# Patient Record
Sex: Female | Born: 1965 | Race: Black or African American | Hispanic: No | Marital: Single | Smoking: Never smoker
Health system: Southern US, Community
[De-identification: ages and names within clinical notes are randomized; demographics above are authoritative.]

## PROBLEM LIST (undated history)

## (undated) DIAGNOSIS — I10 Essential (primary) hypertension: Secondary | ICD-10-CM

## (undated) DIAGNOSIS — F32A Depression, unspecified: Secondary | ICD-10-CM

## (undated) DIAGNOSIS — E119 Type 2 diabetes mellitus without complications: Secondary | ICD-10-CM

## (undated) DIAGNOSIS — E7800 Pure hypercholesterolemia, unspecified: Secondary | ICD-10-CM

## (undated) DIAGNOSIS — F419 Anxiety disorder, unspecified: Secondary | ICD-10-CM

## (undated) DIAGNOSIS — F329 Major depressive disorder, single episode, unspecified: Secondary | ICD-10-CM

---

## 2004-03-08 ENCOUNTER — Ambulatory Visit: Payer: Self-pay | Admitting: Internal Medicine

## 2007-02-22 ENCOUNTER — Ambulatory Visit: Payer: Self-pay | Admitting: Internal Medicine

## 2007-02-22 ENCOUNTER — Other Ambulatory Visit: Payer: Self-pay

## 2017-02-06 ENCOUNTER — Emergency Department
Admission: EM | Admit: 2017-02-06 | Discharge: 2017-02-06 | Disposition: A | Discharge status: Home / Self Care | Payer: BLUE CROSS/BLUE SHIELD | Attending: Emergency Medicine | Admitting: Emergency Medicine

## 2017-02-06 ENCOUNTER — Encounter: Payer: Self-pay | Admitting: Emergency Medicine

## 2017-02-06 DIAGNOSIS — I10 Essential (primary) hypertension: Secondary | ICD-10-CM | POA: Diagnosis not present

## 2017-02-06 DIAGNOSIS — R42 Dizziness and giddiness: Secondary | ICD-10-CM | POA: Diagnosis present

## 2017-02-06 DIAGNOSIS — E119 Type 2 diabetes mellitus without complications: Secondary | ICD-10-CM | POA: Insufficient documentation

## 2017-02-06 DIAGNOSIS — R110 Nausea: Secondary | ICD-10-CM | POA: Insufficient documentation

## 2017-02-06 DIAGNOSIS — H81391 Other peripheral vertigo, right ear: Secondary | ICD-10-CM | POA: Insufficient documentation

## 2017-02-06 HISTORY — DX: Essential (primary) hypertension: I10

## 2017-02-06 HISTORY — DX: Type 2 diabetes mellitus without complications: E11.9

## 2017-02-06 HISTORY — DX: Pure hypercholesterolemia, unspecified: E78.00

## 2017-02-06 LAB — COMPREHENSIVE METABOLIC PANEL
ALBUMIN: 4 g/dL (ref 3.5–5.0)
ALT: 33 U/L (ref 14–54)
AST: 24 U/L (ref 15–41)
Alkaline Phosphatase: 73 U/L (ref 38–126)
Anion gap: 5 (ref 5–15)
BUN: 7 mg/dL (ref 6–20)
CHLORIDE: 106 mmol/L (ref 101–111)
CO2: 25 mmol/L (ref 22–32)
CREATININE: 0.58 mg/dL (ref 0.44–1.00)
Calcium: 8.9 mg/dL (ref 8.9–10.3)
GFR calc Af Amer: >60 mL/min (ref >60)
GLUCOSE: 100 mg/dL — AB (ref 65–99)
Potassium: 3.7 mmol/L (ref 3.5–5.1)
SODIUM: 136 mmol/L (ref 135–145)
Total Bilirubin: 0.6 mg/dL (ref 0.3–1.2)
Total Protein: 7.3 g/dL (ref 6.5–8.1)

## 2017-02-06 LAB — TROPONIN I: Troponin I: <0.03 ng/mL (ref <0.03)

## 2017-02-06 LAB — URINALYSIS, COMPLETE (UACMP) WITH MICROSCOPIC
Bilirubin Urine: NEGATIVE
GLUCOSE, UA: NEGATIVE mg/dL
Hgb urine dipstick: NEGATIVE
Ketones, ur: NEGATIVE mg/dL
LEUKOCYTES UA: NEGATIVE
Nitrite: NEGATIVE
PROTEIN: NEGATIVE mg/dL
Specific Gravity, Urine: 1.018 (ref 1.005–1.030)
pH: 5 (ref 5.0–8.0)

## 2017-02-06 LAB — CBC
HCT: 37.6 % (ref 35.0–47.0)
Hemoglobin: 12.3 g/dL (ref 12.0–16.0)
MCH: 26.5 pg (ref 26.0–34.0)
MCHC: 32.8 g/dL (ref 32.0–36.0)
MCV: 80.9 fL (ref 80.0–100.0)
PLATELETS: 168 10*3/uL (ref 150–440)
RBC: 4.64 MIL/uL (ref 3.80–5.20)
RDW: 17.5 % — ABNORMAL HIGH (ref 11.5–14.5)
WBC: 9.1 10*3/uL (ref 3.6–11.0)

## 2017-02-06 LAB — POCT PREGNANCY, URINE: Preg Test, Ur: NEGATIVE

## 2017-02-06 MED ORDER — MECLIZINE HCL 25 MG PO TABS: 25.0000 mg | ORAL_TABLET | Freq: Three times a day (TID) | ORAL | 0 refills | Status: DC | PRN

## 2017-02-06 MED ORDER — MECLIZINE HCL 25 MG PO TABS
50.0000 mg | ORAL_TABLET | Freq: Once | ORAL | Status: AC
  Administered 2017-02-06: 50 mg via ORAL
  Filled 2017-02-06: qty 2

## 2017-02-06 NOTE — ED Triage Notes (Signed)
Dizzy and nauseated x 3 days. No fevers, no earache.

## 2017-02-06 NOTE — ED Provider Notes (Signed)
[facility] Emergency Department Provider Note  ____________________________________________   First MD Initiated Contact with Patient 02/06/17 2008     (approximate)  I have reviewed the triage vital signs and the nursing notes.   HISTORY  Chief Complaint Dizziness and Nausea    HPI Stacey Watts is a 51 y.o. female who self presents to the emergency department with 3 days of rooms spinning vertigo. The symptoms happen when she gets up or turns her head. They last roughly 1 minute and then stopped when she is at rest. When she has the vertigo she feels quite nauseated but has not thrown up. She denies headache. She denies chest pain shortness of breath abdominal pain or vomiting. She denies numbness or weakness. She denies trauma. She denies tinnitus. This has never happened to her before. Denies recent URI.   Past Medical History:  Diagnosis Date  . Diabetes mellitus without complication (HCC)   . Hypercholesterolemia   . Hypertension     There are no active problems to display for this patient.   History reviewed. No pertinent surgical history.  Prior to Admission medications   Medication Sig Start Date End Date Taking? Authorizing Provider  meclizine (ANTIVERT) 25 MG tablet Take 1 tablet (25 mg total) by mouth 3 (three) times daily as needed. 02/06/17   Merrily Brittle, MD    Allergies Patient has no known allergies.  No family history on file.  Social History Social History  Substance Use Topics  . Smoking status: Never Smoker  . Smokeless tobacco: Not on file  . Alcohol use Not on file    Review of Systems Constitutional: No fever/chills Eyes: No visual changes. ENT: No sore throat. Cardiovascular: Denies chest pain. Respiratory: Denies shortness of breath. Gastrointestinal: No abdominal pain.  positive for nausea, no vomiting.  No diarrhea.  No constipation. Genitourinary: Negative for dysuria. Musculoskeletal: Negative for  back pain. Skin: Negative for rash. Neurological: positive for vertigo.   ____________________________________________   PHYSICAL EXAM:  VITAL SIGNS: ED Triage Vitals  Enc Vitals Group     BP 02/06/17 1857 132/66     Pulse Rate 02/06/17 1857 72     Resp 02/06/17 1857 18     Temp 02/06/17 1857 98.2 F (36.8 C)     Temp Source 02/06/17 1857 Oral     SpO2 02/06/17 1857 96 %     Weight 02/06/17 1900 (!) 349 lb (158.3 kg)     Height 02/06/17 1900  (1.753 m)     Head Circumference --      Peak Flow --      Pain Score --      Pain Loc --      Pain Edu? --      Excl. in GC? --     Constitutional: alert and oriented 4 well appearing nontoxic no diaphoresis. Her symptoms Eyes: PERRL EOMI. lateral nystagmus fatigable on rightward gaze Head: Atraumatic. normal tympanic membrane on the left right tympanic membrane obscured by a large amount of cerumen Nose: No congestion/rhinnorhea. Mouth/Throat: No trismus Neck: No stridor.   Cardiovascular: Normal rate, regular rhythm. Grossly normal heart sounds.  Good peripheral circulation. Respiratory: Normal respiratory effort.  No retractions. Lungs CTAB and moving good air Gastrointestinal: obese soft nontender Musculoskeletal: No lower extremity edema   Neurologic:   cranial nerves II through XII intact aside from nystagmus No pronator drift  normal finger-nose-finger No dysdiadochokinesis Negative Romberg Skin:  Skin is warm, dry and intact.  No rash noted. Psychiatric: Mood and affect are normal. Speech and behavior are normal.    ____________________________________________   DIFFERENTIAL includes but not limited to  peripheral vertigo, central vertigo ____________________________________________   LABS (all labs ordered are listed, but only abnormal results are displayed)  Labs Reviewed  CBC - Abnormal; Notable for the following:       Result Value   RDW 17.5 (*)    All other components within normal limits    URINALYSIS, COMPLETE (UACMP) WITH MICROSCOPIC - Abnormal; Notable for the following:    Color, Urine YELLOW (*)    APPearance CLEAR (*)    Bacteria, UA RARE (*)    Squamous Epithelial / LPF 6-30 (*)    All other components within normal limits  COMPREHENSIVE METABOLIC PANEL - Abnormal; Notable for the following:    Glucose, Bld 100 (*)    All other components within normal limits  TROPONIN I  POC URINE PREG, ED  POCT PREGNANCY, URINE    blood work reviewed and interpreted by me as normal __________________________________________  EKG  ED ECG REPORT I, Merrily Brittle, the attending physician, personally viewed and interpreted this ECG.  Date: 02/06/2017 EKG Time:  Rate: 71 Rhythm: normal sinus rhythm QRS Axis: normal Intervals: first-degree AV block ST/T Wave abnormalities: normal Narrative Interpretation: no evidence of acute ischemia _____________________________________  RADIOLOGY   ____________________________________________   PROCEDURES  Procedure(s) performed: no  Procedures  Critical Care performed: no  Observation: no ____________________________________________   INITIAL IMPRESSION / ASSESSMENT AND PLAN / ED COURSE  Pertinent labs & imaging results that were available during my care of the patient were reviewed by me and considered in my medical decision making (see chart for details).  The patient arrives hemodynamically stable and well appearing. She has brief episodes of paroxysms of severe vertigo that is worse with movement. This is either secondary to BPPV or could be secondary to the significant congestion in her right tympanic canal. I was able to remove most of the cerumen and the patient does feel somewhat improved. I will treat her symptomatically with meclizine and refer her to otolaryngology. She verbalizes understanding and agreement with the plan.      ____________________________________________   FINAL CLINICAL IMPRESSION(S)  / ED DIAGNOSES  Final diagnoses:  Peripheral vertigo involving right ear      NEW MEDICATIONS STARTED DURING THIS VISIT:  Discharge Medication List as of 02/06/2017  8:25 PM    START taking these medications   Details  meclizine (ANTIVERT) 25 MG tablet Take 1 tablet (25 mg total) by mouth 3 (three) times daily as needed., Starting Mon 02/06/2017, Print         Note:  This document was prepared using Dragon voice recognition software and may include unintentional dictation errors.     Merrily Brittle, MD 02/06/17 2330

## 2017-02-06 NOTE — Discharge Instructions (Signed)
Please take your meclizine as needed for symptomatic treatment and make an appointment to follow up with the otolaryngologist within 1 week for reevaluation. Return to the emergency department for any concerns.  It was a pleasure to take care of you today, and thank you for coming to our emergency department.  If you have any questions or concerns before leaving please ask the nurse to grab me and I'm more than happy to go through your aftercare instructions again.  If you were prescribed any opioid pain medication today such as Norco, Vicodin, Percocet, morphine, hydrocodone, or oxycodone please make sure you do not drive when you are taking this medication as it can alter your ability to drive safely.  If you have any concerns once you are home that you are not improving or are in fact getting worse before you can make it to your follow-up appointment, please do not hesitate to call 911 and come back for further evaluation.  Merrily Brittle, MD  Results for orders placed or performed during the hospital encounter of 02/06/17  CBC  Result Value Ref Range   WBC 9.1 3.6 - 11.0 K/uL   RBC 4.64 3.80 - 5.20 MIL/uL   Hemoglobin 12.3 12.0 - 16.0 g/dL   HCT 96.0 45.4 - 09.8 %   MCV 80.9 80.0 - 100.0 fL   MCH 26.5 26.0 - 34.0 pg   MCHC 32.8 32.0 - 36.0 g/dL   RDW 11.9 (H) 14.7 - 82.9 %   Platelets 168 150 - 440 K/uL  Urinalysis, Complete w Microscopic  Result Value Ref Range   Color, Urine YELLOW (A) YELLOW   APPearance CLEAR (A) CLEAR   Specific Gravity, Urine 1.018 1.005 - 1.030   pH 5.0 5.0 - 8.0   Glucose, UA NEGATIVE NEGATIVE mg/dL   Hgb urine dipstick NEGATIVE NEGATIVE   Bilirubin Urine NEGATIVE NEGATIVE   Ketones, ur NEGATIVE NEGATIVE mg/dL   Protein, ur NEGATIVE NEGATIVE mg/dL   Nitrite NEGATIVE NEGATIVE   Leukocytes, UA NEGATIVE NEGATIVE   RBC / HPF 0-5 0 - 5 RBC/hpf   WBC, UA 0-5 0 - 5 WBC/hpf   Bacteria, UA RARE (A) NONE SEEN   Squamous Epithelial / LPF 6-30 (A) NONE SEEN   Mucus PRESENT   Comprehensive metabolic panel  Result Value Ref Range   Sodium 136 135 - 145 mmol/L   Potassium 3.7 3.5 - 5.1 mmol/L   Chloride 106 101 - 111 mmol/L   CO2 25 22 - 32 mmol/L   Glucose, Bld 100 (H) 65 - 99 mg/dL   BUN 7 6 - 20 mg/dL   Creatinine, Ser 5.62 0.44 - 1.00 mg/dL   Calcium 8.9 8.9 - 13.0 mg/dL   Total Protein 7.3 6.5 - 8.1 g/dL   Albumin 4.0 3.5 - 5.0 g/dL   AST 24 15 - 41 U/L   ALT 33 14 - 54 U/L   Alkaline Phosphatase 73 38 - 126 U/L   Total Bilirubin 0.6 0.3 - 1.2 mg/dL   GFR calc non Af Amer >60 >60 mL/min   GFR calc Af Amer >60 >60 mL/min   Anion gap 5 5 - 15  Troponin I  Result Value Ref Range   Troponin I <0.03 <0.03 ng/mL  Pregnancy, urine POC  Result Value Ref Range   Preg Test, Ur NEGATIVE NEGATIVE

## 2017-08-31 ENCOUNTER — Other Ambulatory Visit: Payer: Self-pay

## 2017-08-31 ENCOUNTER — Encounter: Payer: Self-pay | Admitting: Emergency Medicine

## 2017-08-31 ENCOUNTER — Ambulatory Visit (INDEPENDENT_AMBULATORY_CARE_PROVIDER_SITE_OTHER): Payer: BLUE CROSS/BLUE SHIELD

## 2017-08-31 ENCOUNTER — Ambulatory Visit
Admission: EM | Admit: 2017-08-31 | Discharge: 2017-08-31 | Disposition: A | Discharge status: Home / Self Care | Payer: BLUE CROSS/BLUE SHIELD | Attending: Family Medicine | Admitting: Family Medicine

## 2017-08-31 DIAGNOSIS — M79662 Pain in left lower leg: Secondary | ICD-10-CM

## 2017-08-31 DIAGNOSIS — W109XXA Fall (on) (from) unspecified stairs and steps, initial encounter: Secondary | ICD-10-CM

## 2017-08-31 DIAGNOSIS — M79605 Pain in left leg: Secondary | ICD-10-CM | POA: Diagnosis not present

## 2017-08-31 HISTORY — DX: Depression, unspecified: F32.A

## 2017-08-31 HISTORY — DX: Major depressive disorder, single episode, unspecified: F32.9

## 2017-08-31 HISTORY — DX: Anxiety disorder, unspecified: F41.9

## 2017-08-31 MED ORDER — TRAMADOL HCL 50 MG PO TABS: 50.0000 mg | ORAL_TABLET | Freq: Three times a day (TID) | ORAL | 0 refills | Status: AC | PRN

## 2017-08-31 NOTE — Discharge Instructions (Signed)
Xray negative.  Use the medication as directed.  Take care  Dr. Adriana Simasook

## 2017-08-31 NOTE — ED Triage Notes (Signed)
Patient in today c/o left leg pain and swelling and right toe pain after falling on 08/14/17. Patient states they are not getting better like she thinks they should. Patient has tried Ibuprofen, alternating ice and heat, elevated.

## 2017-08-31 NOTE — ED Provider Notes (Signed)
[facility]  CSN: [id number] Arrival date & time: 08/31/17  1421  History   Chief Complaint Chief Complaint  Patient presents with  . Leg Pain    left  . Toe Pain    right   HPI  52 year old female presents with the above complaints.  Patient states that on 4/1 she was going up some steps and fell, injuring her left leg predominantly.  She states that her right second toe was also affected but she is not particularly concerned about the toe.  Her primary complaint is left she is concerned however lower leg pain, particularly anteriorly.  Associated swelling.  She is taken some over-the-counter NSAIDs without significant improvement.  She states that she is able to ambulate without difficulty.  Given pain and lack of resolution since her injury.  No other associated symptoms.  No other complaints at this time.  Past Medical History:  Diagnosis Date  . Anxiety   . Depression   . Diabetes mellitus without complication (HCC)   . Hypercholesterolemia   . Hypertension    Surgical Hx - No past surgeries.  OB History   None    Home Medications    Prior to Admission medications   Medication Sig Start Date End Date Taking? Authorizing Provider  ALPRAZolam Prudy Feeler) 0.5 MG tablet Take by mouth.   Yes [provider]  atorvastatin (LIPITOR) 20 MG tablet TAKE 1 TABLET(S) EVERY DAY BY ORAL ROUTE FOR 90 DAYS. 04/06/16  Yes [provider]  citalopram (CELEXA) 40 MG tablet Take 40 mg by mouth daily. 06/10/17  Yes [provider]  losartan-hydrochlorothiazide (HYZAAR) 50-12.5 MG tablet Take by mouth. 05/04/16  Yes [provider]  traMADol (ULTRAM) 50 MG tablet Take 1 tablet (50 mg total) by mouth every 8 (eight) hours as needed. 08/31/17   Tommie Sams, DO   Family History Family History  Problem Relation Age of Onset  . Ovarian cancer Mother   . Breast cancer Mother   . Hypertension Mother   . Hyperlipidemia Mother   . Heart failure  Mother   . Other Father        suicide   Social History Social History   Tobacco Use  . Smoking status: Never Smoker  . Smokeless tobacco: Never Used  Substance Use Topics  . Alcohol use: Yes    Comment: socially  . Drug use: Never   Allergies   Patient has no known allergies.   Review of Systems Review of Systems  Constitutional: Negative.   Musculoskeletal:       Left lower leg pain. Right 2nd toe pain.   Physical Exam Triage Vital Signs ED Triage Vitals [08/31/17 1449]  Enc Vitals Group     BP 140/65     Pulse Rate 73     Resp 16     Temp 98.6 F (37 C)     Temp Source Oral     SpO2 99 %     Weight (!) 352 lb (159.7 kg)     Height 5\' 10"  (1.778 m)     Head Circumference      Peak Flow      Pain Score 7     Pain Loc      Pain Edu?      Excl. in GC?    Updated Vital Signs BP 140/65 (BP Location: Right Arm)   Pulse 73   Temp 98.6 F (37 C) (Oral)   Resp 16   Ht  5\' 10"  (1.778 m)   Wt (!) 352 lb (159.7 kg)   LMP 07/27/2017 (Approximate)   SpO2 99%   BMI 50.51 kg/m  Physical Exam  Constitutional: She is oriented to person, place, and time. She appears well-developed. No distress.  HENT:  Head: Normocephalic and atraumatic.  Pulmonary/Chest: Effort normal. No respiratory distress.  Musculoskeletal:  Left lower leg -bruising of the anterior tibia noted.  Patient has swelling around the proximal tibia.  Slightly tender to palpation.  Neurological: She is alert and oriented to person, place, and time.  Psychiatric: She has a normal mood and affect. Her behavior is normal.  Nursing note and vitals reviewed.  UC Treatments / Results  Labs (all labs ordered are listed, but only abnormal results are displayed) Labs Reviewed - No data to display  EKG None Radiology Dg Tibia/fibula Left  Result Date: 08/31/2017 CLINICAL DATA:  Left leg pain and swelling after falling on 08/14/2017. EXAM: LEFT TIBIA AND FIBULA - 2 VIEW COMPARISON:  None. FINDINGS: No  fracture is identified. The knee and ankle are grossly located. Minimal patellar spurring is noted. There is mild nonspecific subcutaneous fat reticulation throughout much of the lower leg and at the knee. IMPRESSION: No acute osseous abnormality identified. Electronically Signed   By: Sebastian AcheAllen  Grady M.D.   On: 08/31/2017 15:38    Procedures Procedures (including critical care time)  Medications Ordered in UC Medications - No data to display   Initial Impression / Assessment and Plan / UC Course  I have reviewed the triage vital signs and the nursing notes.  Pertinent labs & imaging results that were available during my care of the patient were reviewed by me and considered in my medical decision making (see chart for details).     52 year old female presents with leg pain after a fall.  Her x-ray was negative.  I suspect that this is soft tissue injury.  Treating with tramadol.  If persists, advised to see sports medicine for musculoskeletal ultrasound.  Final Clinical Impressions(s) / UC Diagnoses   Final diagnoses:  Left leg pain    ED Discharge Orders        Ordered    traMADol (ULTRAM) 50 MG tablet  Every 8 hours PRN     08/31/17 1551     Controlled Substance Prescriptions [place] Controlled Substance Registry consulted? Not Applicable   Tommie SamsCook, Yunus Stoklosa G, DO 08/31/17 1702

## 2019-04-01 IMAGING — CR DG TIBIA/FIBULA 2V*L*
4 series · 4 of 4 positions shown · non-contrast
Comparison: None.

CLINICAL DATA: Left leg pain and swelling after falling on
08/14/2017.

EXAM:
LEFT TIBIA AND FIBULA - 2 VIEW

[tibia ap (1 of 2)]
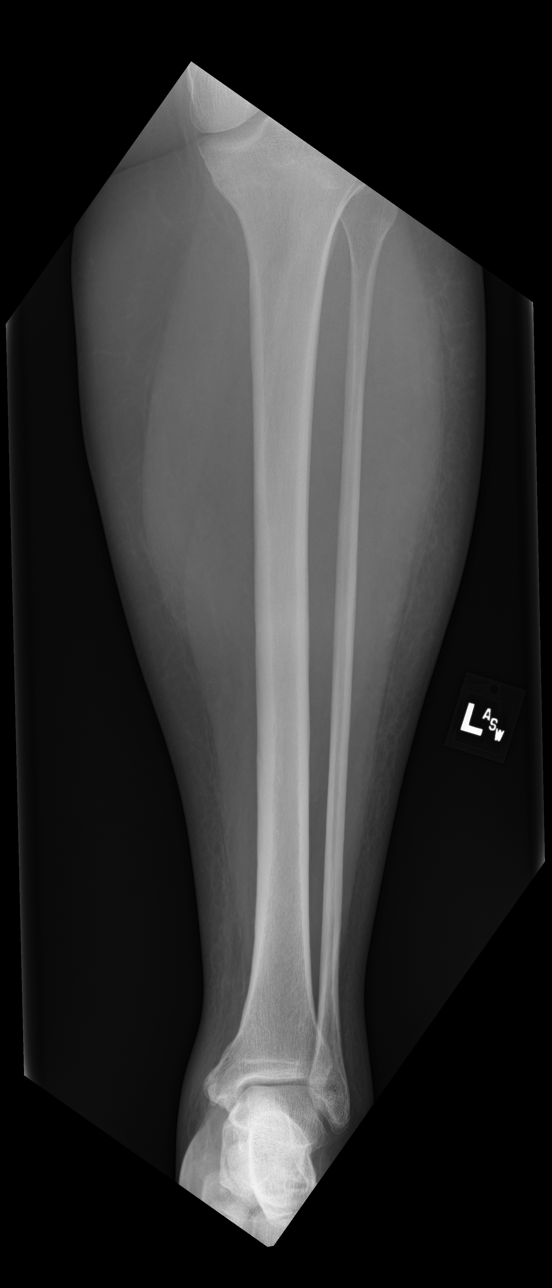

[tibia ap (2 of 2)]
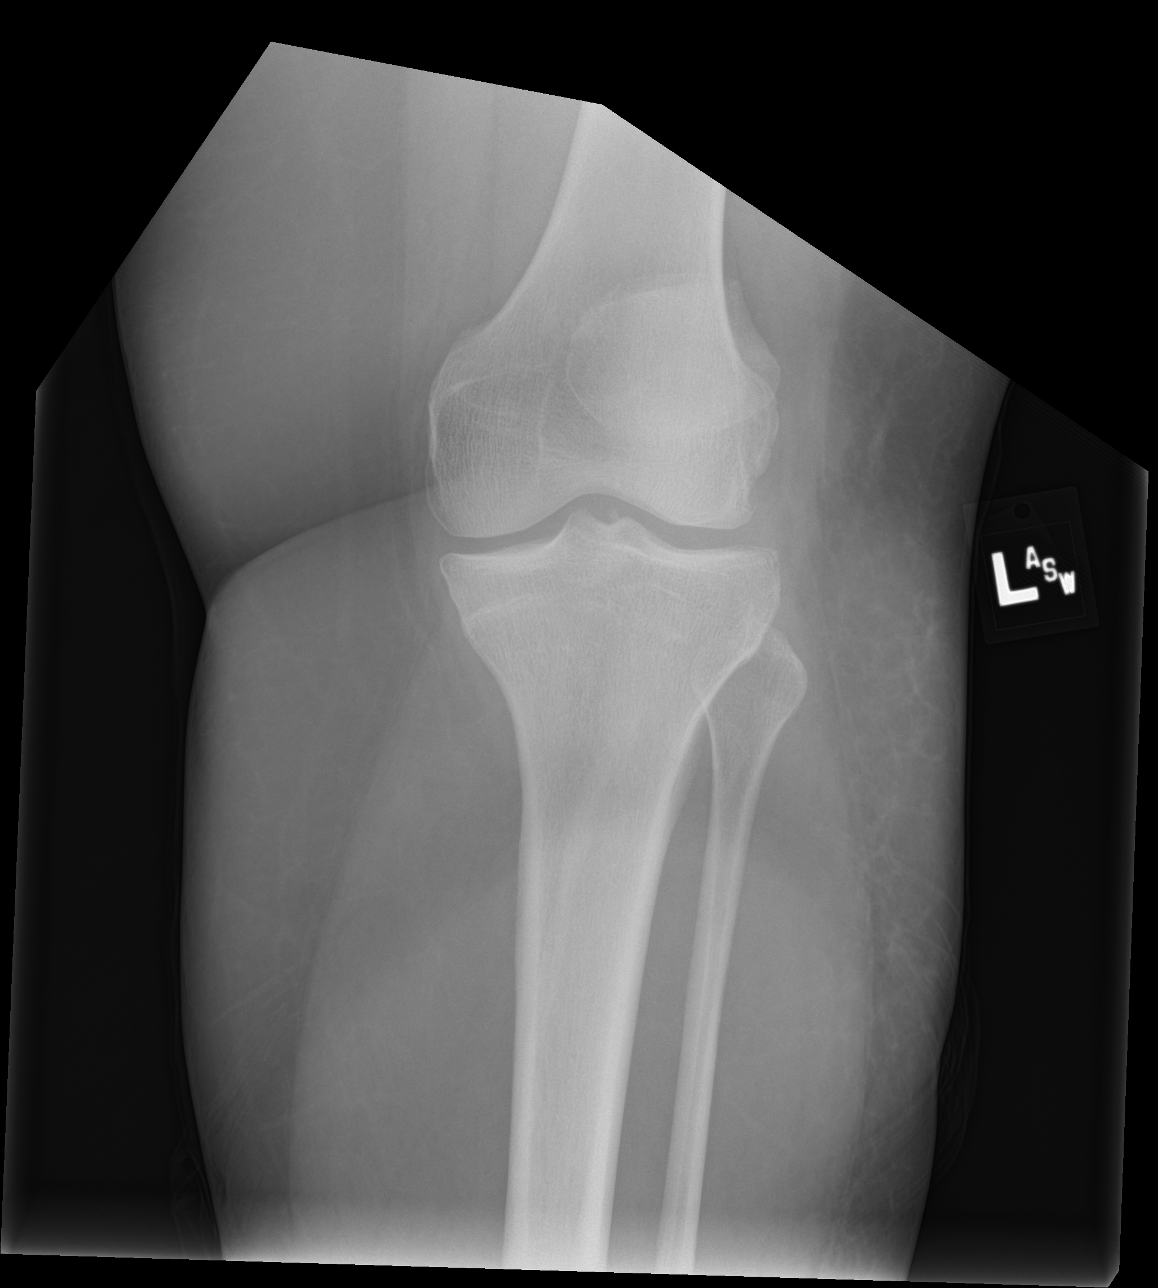

[tibia lat (1 of 2)]
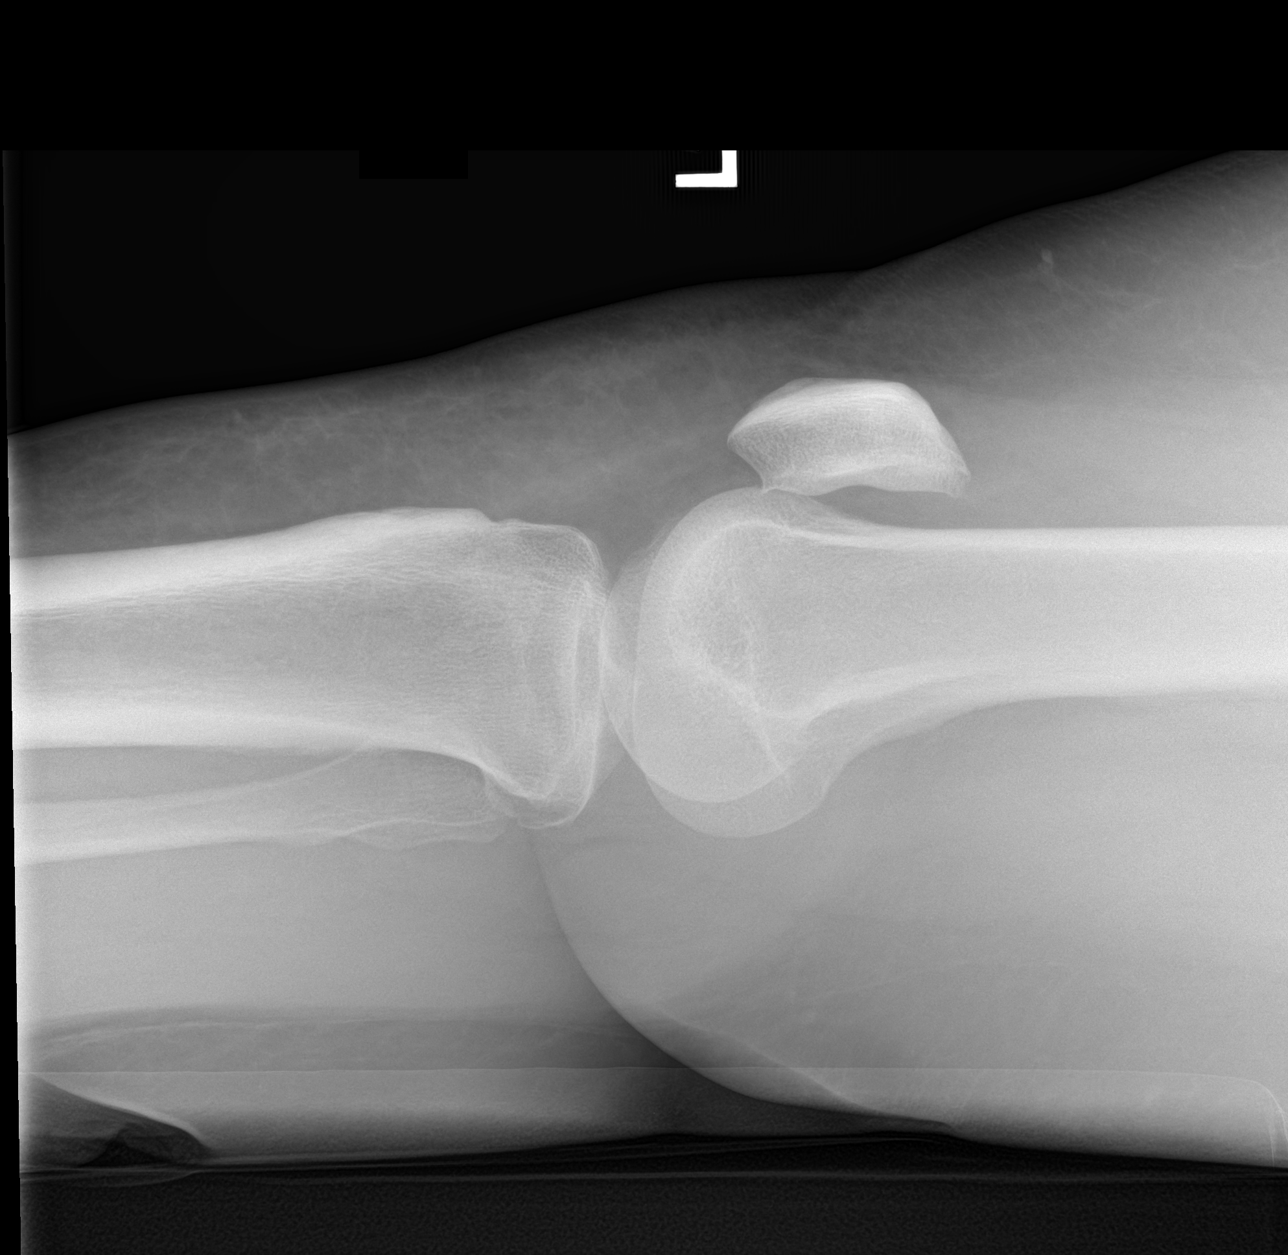

[tibia lat (2 of 2)]
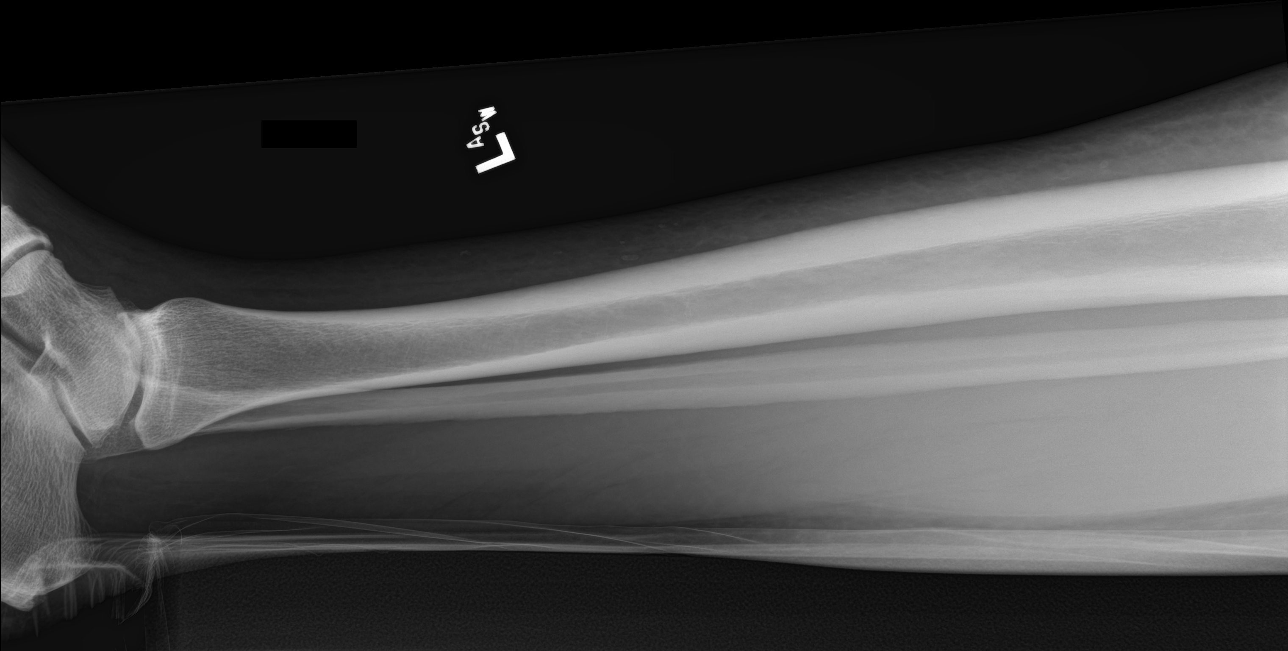

[4 of 4 positions shown; findings below may reference images not displayed]

FINDINGS: No fracture is identified. The knee and ankle are grossly located.
Minimal patellar spurring is noted. There is mild nonspecific
subcutaneous fat reticulation throughout much of the lower leg and
at the knee.
IMPRESSION: No acute osseous abnormality identified.

## 2019-04-19 ENCOUNTER — Other Ambulatory Visit: Payer: Self-pay

## 2019-04-19 ENCOUNTER — Encounter (INDEPENDENT_AMBULATORY_CARE_PROVIDER_SITE_OTHER): Payer: Self-pay

## 2019-04-19 ENCOUNTER — Other Ambulatory Visit: Payer: Self-pay | Admitting: Adult Health

## 2019-04-19 ENCOUNTER — Ambulatory Visit
Admission: RE | Admit: 2019-04-19 | Discharge: 2019-04-19 | Disposition: A | Discharge status: Home / Self Care | Payer: BC Managed Care – PPO | Source: Ambulatory Visit | Attending: Adult Health | Admitting: Adult Health

## 2020-01-29 ENCOUNTER — Other Ambulatory Visit: Payer: BC Managed Care – PPO

## 2020-01-29 ENCOUNTER — Other Ambulatory Visit: Payer: Self-pay

## 2020-01-29 DIAGNOSIS — Z20822 Contact with and (suspected) exposure to covid-19: Secondary | ICD-10-CM

## 2020-01-30 LAB — NOVEL CORONAVIRUS, NAA: SARS-CoV-2, NAA: NOT DETECTED

## 2020-01-30 LAB — SARS-COV-2, NAA 2 DAY TAT

## 2020-11-17 IMAGING — US US EXTREM LOW VENOUS
1 series · 13 of 24 positions shown · non-contrast
Comparison: None.

CLINICAL DATA: Morbid obesity. History of bilateral lower extremity
pain the past 2 weeks. Evaluate for DVT.



[Series 1: us extrem low venous · 0.11mm/px · 13 of 62 slices shown]
[im 1/62]
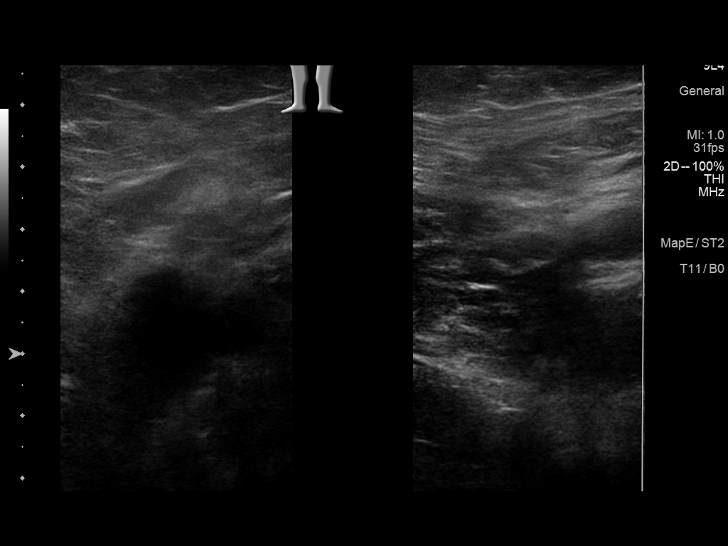
[im 6/62]
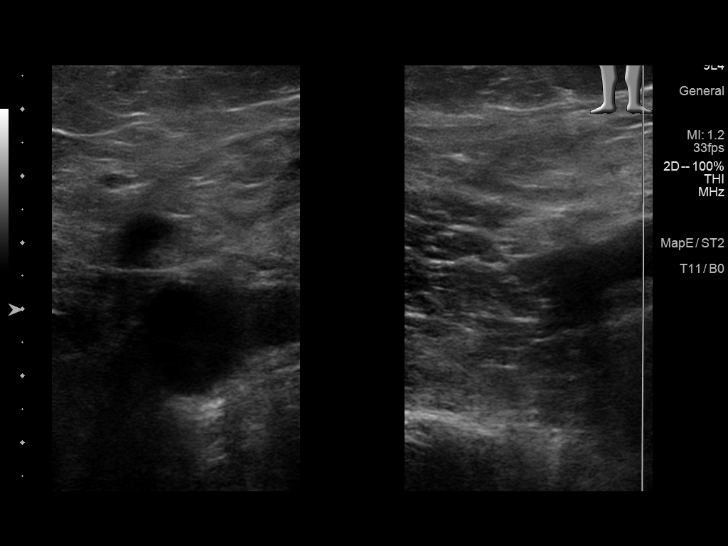
[im 11/62]
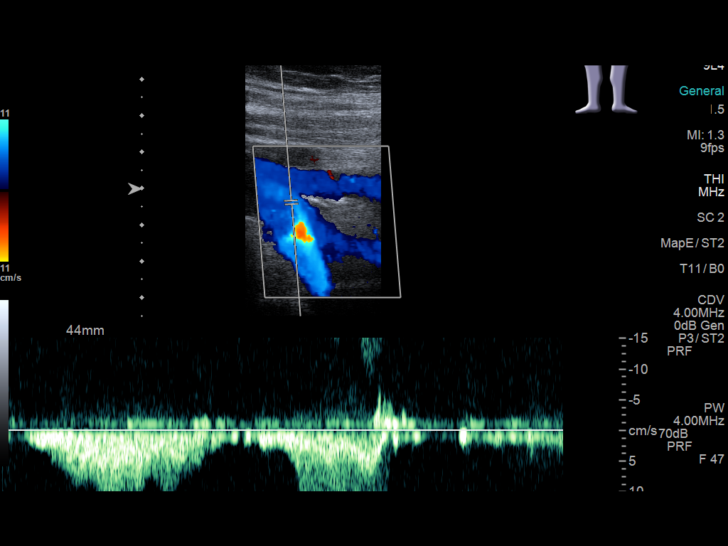
[im 16/62]
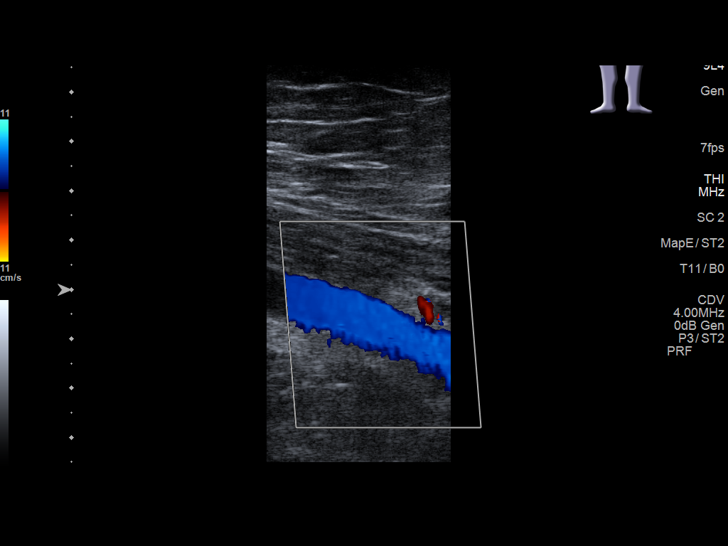
[im 22/62]
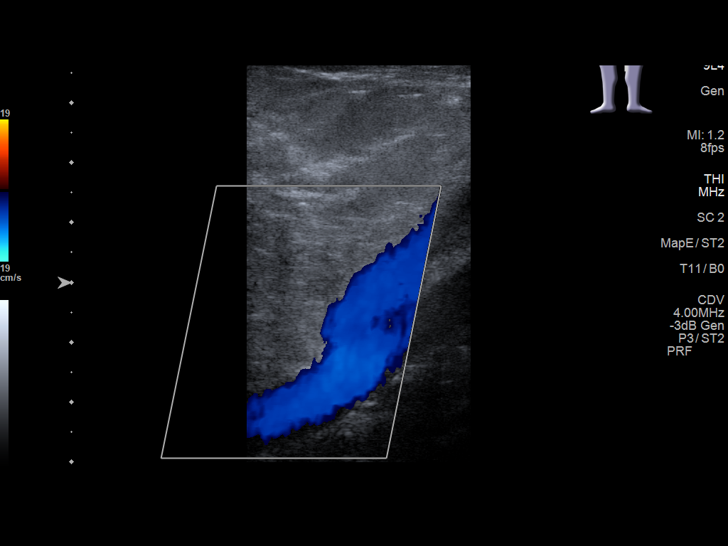
[im 27/62]
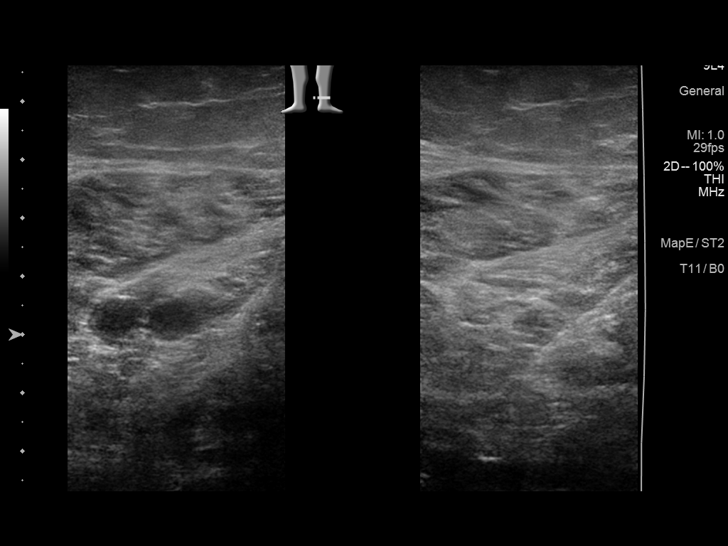
[im 32/62]
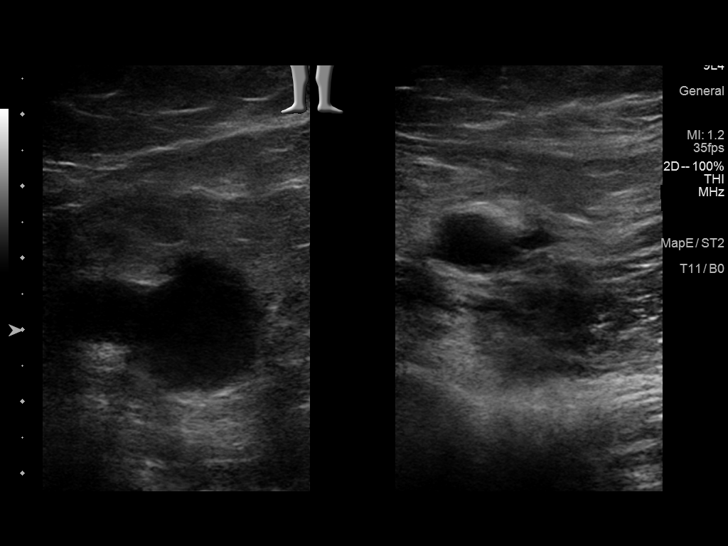
[im 35/62]
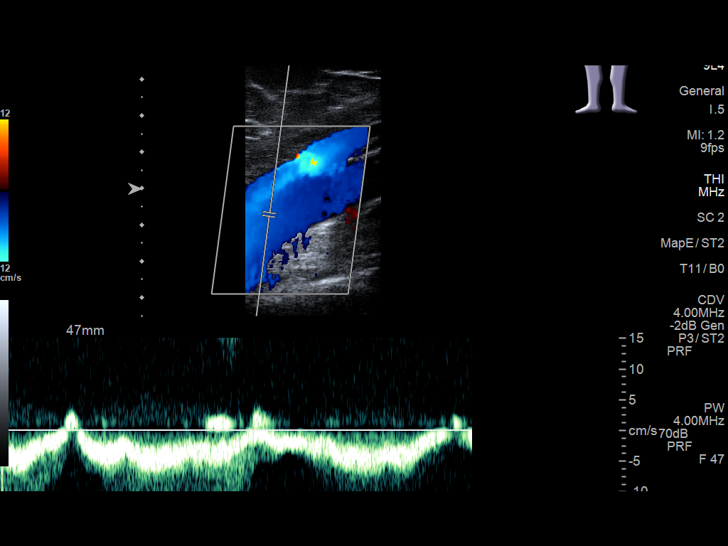
[im 40/62]
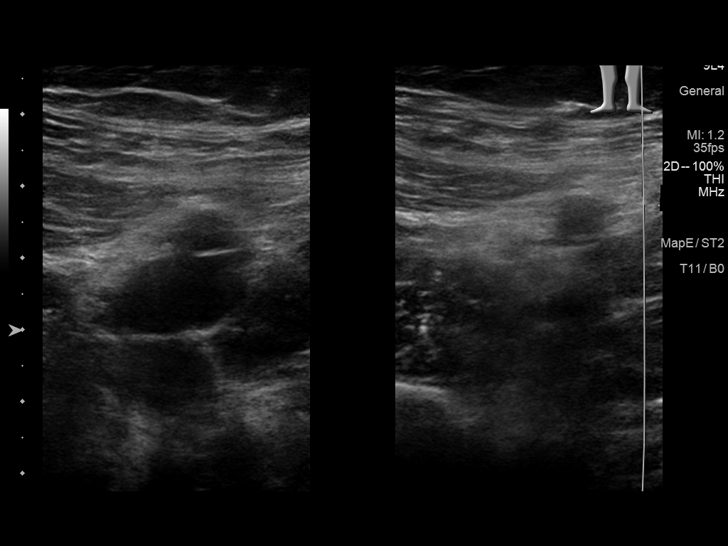
[im 46/62]
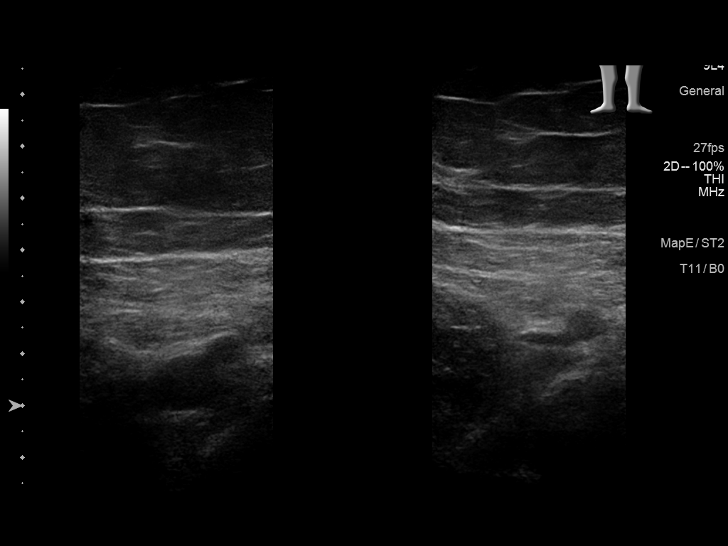
[im 51/62]
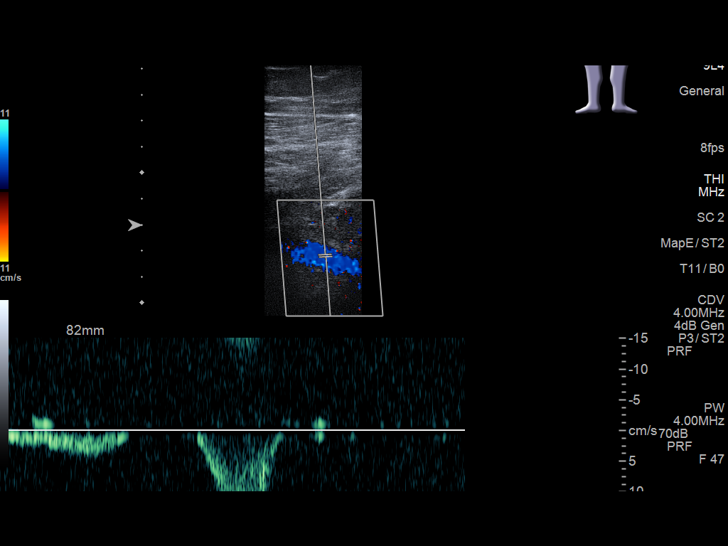
[im 56/62]
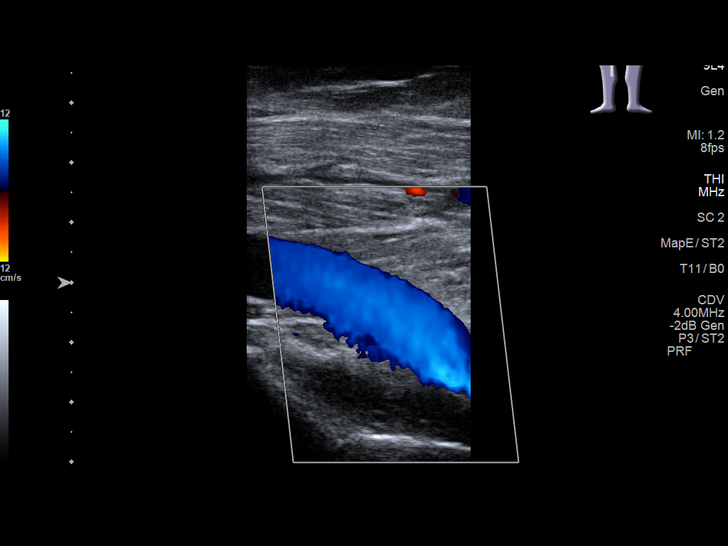
[im 62/62]
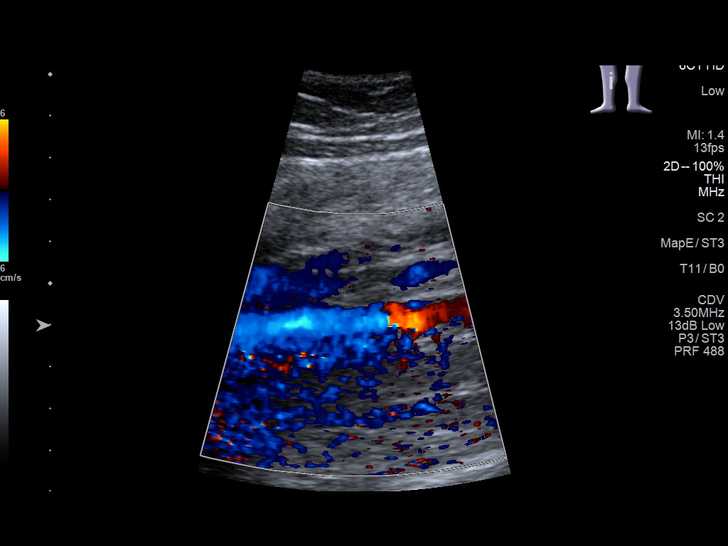

[13 of 24 positions shown; findings below may reference images not displayed]

FINDINGS: RIGHT LOWER EXTREMITY

Common Femoral Vein: No evidence of thrombus. Normal
compressibility, respiratory phasicity and response to augmentation.

Saphenofemoral Junction: No evidence of thrombus. Normal
compressibility and flow on color Doppler imaging.

Profunda Femoral Vein: No evidence of thrombus. Normal
compressibility and flow on color Doppler imaging.

Femoral Vein: No evidence of thrombus. Normal compressibility,
respiratory phasicity and response to augmentation.

Popliteal Vein: No evidence of thrombus. Normal compressibility,
respiratory phasicity and response to augmentation.

Calf Veins: No evidence of thrombus. Normal compressibility and flow
on color Doppler imaging.

Superficial Great Saphenous Vein: No evidence of thrombus. Normal
compressibility.

Venous Reflux:  None.

Other Findings:  None.

LEFT LOWER EXTREMITY

Common Femoral Vein: No evidence of thrombus. Normal
compressibility, respiratory phasicity and response to augmentation.

Saphenofemoral Junction: No evidence of thrombus. Normal
compressibility and flow on color Doppler imaging.

Profunda Femoral Vein: No evidence of thrombus. Normal
compressibility and flow on color Doppler imaging.

Femoral Vein: No evidence of thrombus. Normal compressibility,
respiratory phasicity and response to augmentation.

Popliteal Vein: No evidence of thrombus. Normal compressibility,
respiratory phasicity and response to augmentation.

Calf Veins: No evidence of thrombus. Normal compressibility and flow
on color Doppler imaging.

Superficial Great Saphenous Vein: No evidence of thrombus. Normal
compressibility.

Venous Reflux:  None.

Other Findings:  None.
IMPRESSION: No evidence of DVT within either lower extremity.
